# Patient Record
Sex: Male | Born: 1975 | Race: White | Hispanic: No | Marital: Married | Smoking: Never smoker
Health system: Southern US, Community
[De-identification: ages and names within clinical notes are randomized; demographics above are authoritative.]

## PROBLEM LIST (undated history)

## (undated) DIAGNOSIS — R011 Cardiac murmur, unspecified: Secondary | ICD-10-CM

## (undated) DIAGNOSIS — G43909 Migraine, unspecified, not intractable, without status migrainosus: Secondary | ICD-10-CM

## (undated) DIAGNOSIS — Z531 Procedure and treatment not carried out because of patient's decision for reasons of belief and group pressure: Secondary | ICD-10-CM

## (undated) DIAGNOSIS — D6800 Von Willebrand disease, unspecified: Secondary | ICD-10-CM

## (undated) DIAGNOSIS — IMO0001 Reserved for inherently not codable concepts without codable children: Secondary | ICD-10-CM

## (undated) HISTORY — DX: Cardiac murmur, unspecified: R01.1

## (undated) HISTORY — PX: FOOT SURGERY: SHX648

## (undated) HISTORY — DX: Migraine, unspecified, not intractable, without status migrainosus: G43.909

## (undated) HISTORY — PX: WISDOM TOOTH EXTRACTION: SHX21

---

## 2005-08-04 ENCOUNTER — Ambulatory Visit: Payer: Self-pay | Admitting: Internal Medicine

## 2005-08-17 ENCOUNTER — Ambulatory Visit (HOSPITAL_COMMUNITY): Admission: RE | Admit: 2005-08-17 | Discharge: 2005-08-18 | Payer: Self-pay | Admitting: Surgery

## 2005-08-17 ENCOUNTER — Encounter (INDEPENDENT_AMBULATORY_CARE_PROVIDER_SITE_OTHER): Payer: Self-pay | Admitting: *Deleted

## 2006-08-24 IMAGING — RF DG CHOLANGIOGRAM OPERATIVE
1 series · 4 of 4 positions shown · non-contrast
Comparison: none

CLINICAL DATA: The patient has gallstones.
 INTRAOPERATIVE CHOLANGIOGRAM:
TECHNIQUE: Multiple fluoroscopic spot radiographs were obtained during intraoperative cholangiogram, and are submitted for interpretation post-operatively.
 No comparison.

[Series 1: run · 4 of 53 frames shown]
[frame 8/53]
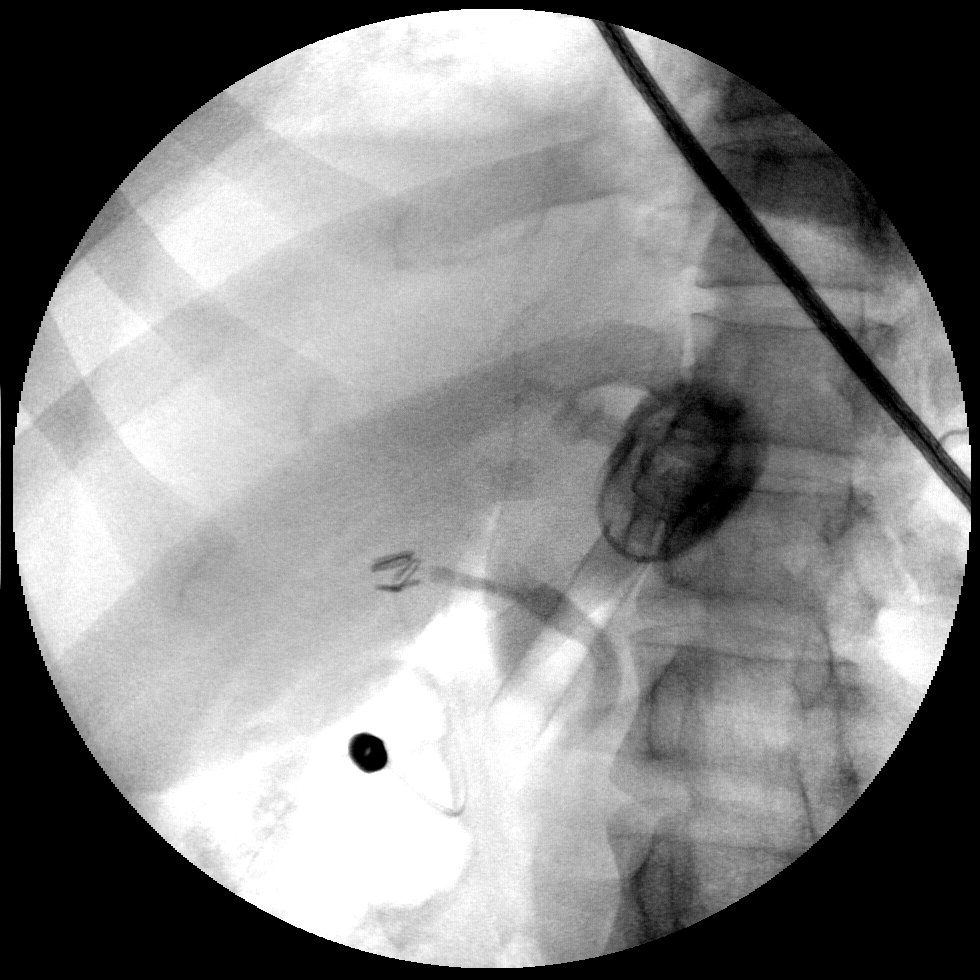
[frame 27/53]
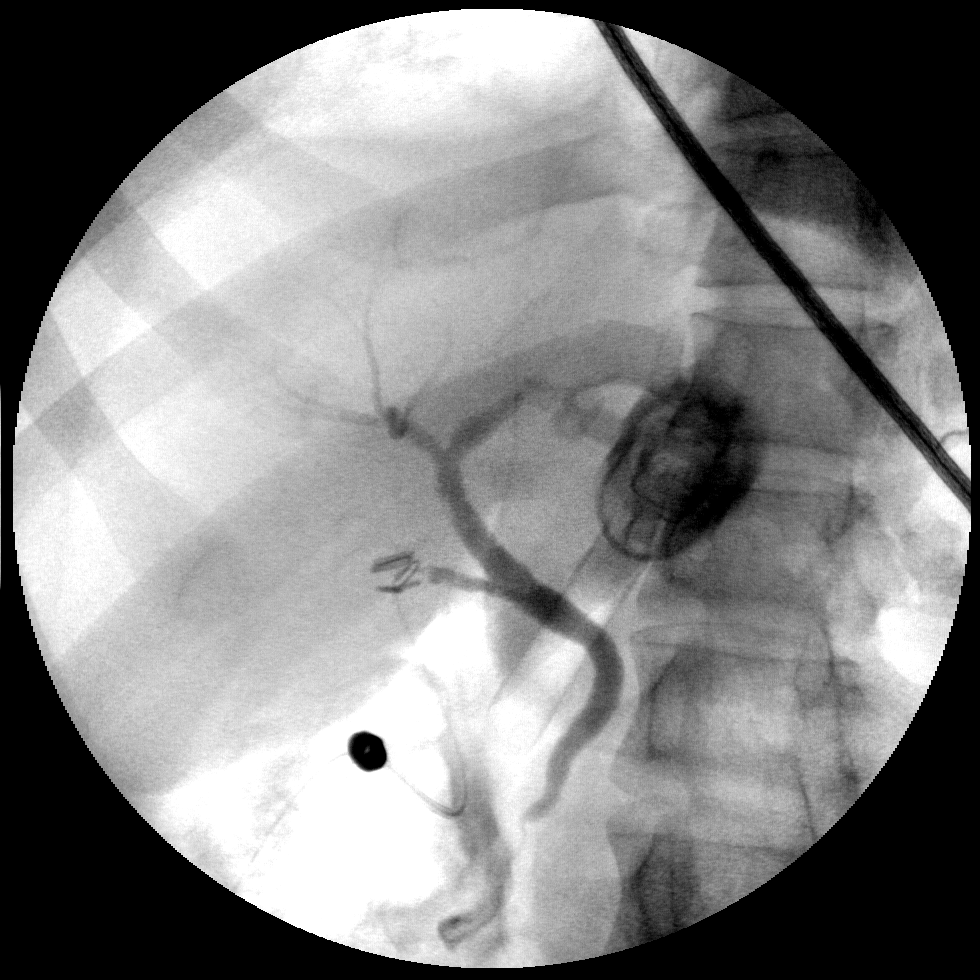
[frame 45/53]
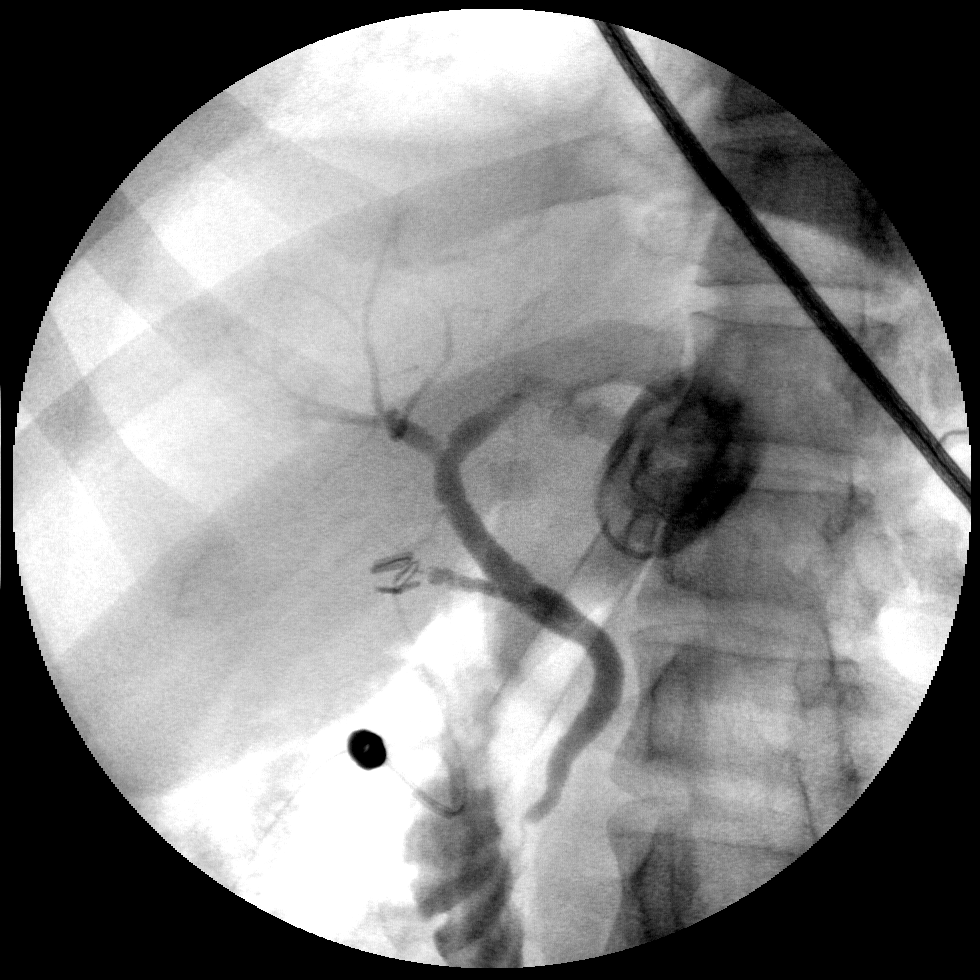
[frame 46/53]
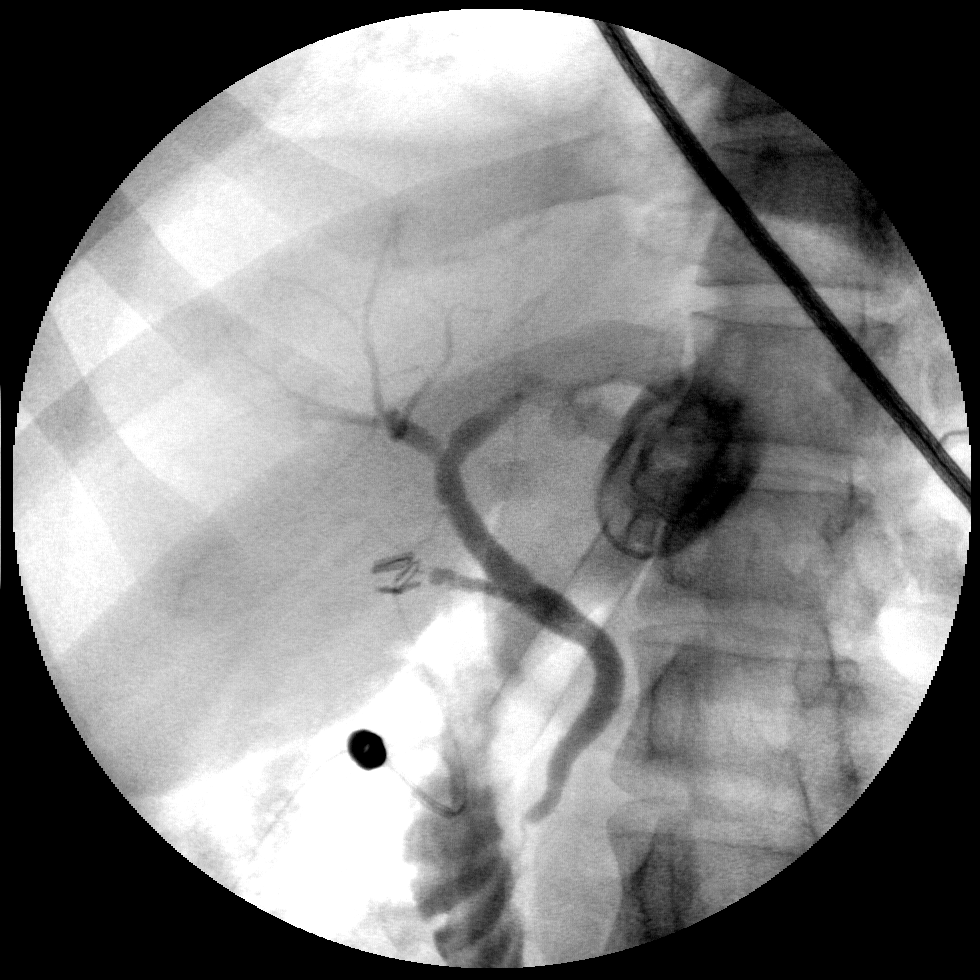

[4 of 4 positions shown; findings below may reference images not displayed]

FINDINGS: The common bile duct is well visualized without evidence of obstruction.  There is prompt filling of the duodenum.
IMPRESSION: No evidence of obstruction or retained gallstones in the common bile duct.

## 2008-07-25 ENCOUNTER — Encounter (INDEPENDENT_AMBULATORY_CARE_PROVIDER_SITE_OTHER): Payer: Self-pay | Admitting: Otolaryngology

## 2008-07-25 ENCOUNTER — Ambulatory Visit (HOSPITAL_COMMUNITY): Admission: RE | Admit: 2008-07-25 | Discharge: 2008-07-25 | Payer: Self-pay | Admitting: Otolaryngology

## 2011-03-24 NOTE — Op Note (Signed)
NAME:  George Richard, George Richard NO.:  [id number]   MEDICAL RECORD NO.:  [id number]          PATIENT TYPE:  AMB   LOCATION:  SDS                          FACILITY:  [facility]   PHYSICIAN:  Antony Contras, MD     DATE OF BIRTH:  12-Oct-1976   DATE OF PROCEDURE:  07/25/2008  DATE OF DISCHARGE:  07/25/2008                               OPERATIVE REPORT   PREOPERATIVE DIAGNOSES:  1. Septal deviation.  2. Turbinate hypertrophy.  3. Internal nasal valve collapse.  4. Dorsal hump.  5. Left tongue lesion.   POSTOPERATIVE DIAGNOSES:  1. Septal deviation.  2. Turbinate hypertrophy.  3. Internal nasal valve collapse.  4. Dorsal hump.  5. Left tongue lesion.   PROCEDURE:  1. Septoplasty.  2. Bilateral inferior turbinate reduction.  3. Excision of left tongue lesion measuring 0.8 cm.  4. Open rhinoplasty with placement of butterfly graft and hump      reduction.   SURGEON:  Antony Contras, MD   ANESTHESIA:  General endotracheal anesthesia.   COMPLICATIONS:  None.   INDICATIONS:  The patient is a 35 year old male, who has a life-long  history of nasal obstruction, which is worse on the right side.  The  problem has been worse for the past year.  His nose breathing whistle  sometimes.  He chokes at night and wakes up gasping at times.  He snores  only occasionally.  Also, he has had a lesion on the left side of his  tongue for a year and a half.   Findings demonstrated a severely deviated nasal septum toward the right  anteriorly and with a large spur to the left.  Turbinates are moderately  enlarged.  The internal nasal valves are narrowed as is the external  nose at the valve area.  He has a hump of the dorsum as well.  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  He presents to the operating room for  surgical management.  He has mild von Willebrand disease and was given  DDAVP preoperatively.   FINDINGS:  As above.   DESCRIPTION OF PROCEDURE:  The patient was identified in the holding  room, an informed consent having been obtained including discussion of  risks, benefits, and alternatives, the patient was brought to the  operating suite and placed on the operating table in supine position.  Anesthesia was induced and the patient was intubated by anesthesia team  without difficulty.  The patient was given intravenous antibiotics and  steroids during the case.  The eyes taped closed.  The tongue was  grasped with a towel clip and pulled anteriorly.  The site of the left  lateral tongue lesion was injected with 1% lidocaine with 1:100,000  epinephrine.  An elliptical incision was made around the tongue lesion  using Bovie electrocautery.  This was then extended deeply until the  lesion was removed also using electrocautery.  The site was then closed  with 3-0 chromic suture in a simple interrupted fashion.  The  lesion was  given to nursing for permanent pathology.  At this point, the nose was  packed with Afrin-soaked pledgets on both sides and the external nose  was marked with marking pen.  The face including the left ear was  prepped and draped in sterile fashion.  After several minutes, the  pledgets were removed and the nasal septum was injected with 1%  lidocaine with 1:100,000 epinephrine.  This was done on both sides.  A  left-sided Killian incision was then made using a #15 blade scalpel and  a subperichondrial flap was elevated along the left side of cartilage  beyond the bony cartilaginous junction and around the large spur.  A  hole was made in the mucosal flap elevating it off of the spur.  The  front of the caudal end of the septum was then dissected along the  opposite side and elevating the subperichondrial flap also on the  opposite side.  The large spur on the left side was then removed by  first removing the cartilage portion using a #15 blade scalpel and  Market researcher.  The bony portion was then removed by elevating soft tissues  off of both sides and using an osteotome.  The anterior septal cartilage  was then examined and a large deviation was seen toward the right side.  Thus, a rectangular-shaped piece of cartilage was removed using #15  blade scalpel and Freer elevator largely reducing the deviation.  This  cartilage was placed in saline.  The caudal strut of the cartilage was  then placed along the left side of the maxillary spine and secured there  using a 5-0 nylon suture.  This helped to further reduce the deviation  of the caudal strut.  At this point, the inferior turbinates were  injected with 1% lidocaine with 1:100,000 epinephrine on both sides.  The stab incision made anteriorly using a #15 blade scalpel and a Market researcher was used to elevate the soft tissues off the underlying bone.  This was done very easily on the right side and more difficult on the  left side due to scar.  On the left side, a hole was made in the flap  and the soft tissues were further elevated off the underlying bone.  The  inferior portion of the bone was then fractured and removed in piecemeal  fashion using Takahashi forceps and rongeurs.  The soft tissue was then  reduced using the turbinate blade and the microdebrider.  The inferior  portion of the turbinate was hanging and was then secured to the  remainder of the turbinate using 4-0 chromic with a single suture.  On  the right side, the flap elevated better and the submucosal tissue was  removed using the microdebrider.  Soft tissues were redraped.  Afrin  pledgets were replaced in the nose.  The external nasal and other side  of the rhinoplasty incision was then marked with marking pen including a  columellar incision and then a marginal incision along the edge of the  lower lateral cartilage.  The external nose was then injected with 1%  lidocaine with 1:100,000 epinephrine.  After  several minutes, the  columellar incision was made #15 blade scalpel followed by the marginal  incision.  Soft tissues were then elevated using the scissors and hooks,  elevating the nasal tip.  Bleeding was controlled with bipolar  electrocautery.  Soft tissues were then elevated off of the lower  lateral cartilages starting on the  left side and then working on the  right side by pulling down the cartilage with a hook and elevating the  soft tissues as well.  This continued until the lower lateral cartilages  were fully exposed.  Once exposed, it became clear that the lateral  domes of both lower lateral cartilages were concave.  Soft tissues were  then further elevated off the upper lateral cartilages using blunt and  sharp dissection and a retractor.  This was done up to the root of the  nose.  At this point, the septal cartilage was examined and found to  have a good curvature for butterfly graft.  Thus, an ear cartilage graft  was not used.  The cartilage was then carved with a #15 blade scalpel  and then laid over the lower lateral cartilages centered over the  midline.  The lower lateral cartilages were then sutured to the  cartilage graft using a 5-0 nylon suture in a mattress suture on both  sides.  This allowed to make the lower laterals more convex to hold the  mouth.  At this point, the dorsal hump was examined and the external  nose markings were utilized to estimate the hump reduction.  This was  then performed with #15 blade scalpel removing portion of the upper  lateral cartilages along the midline.  This included portion of the  septal cartilage.  A rasp was then used to blend this into the nasal  bones superiorly.  Careful dissection was performed to try to make this  to smoothen and flat as possible.  The upper lateral cartilages were  then secured to the septal cartilage using the 5-0 nylon in two simple  sutures.  At this point, the nasal dissection was suctioned and  the skin  and soft tissue envelope laid back over the cartilages.  The columellar  incision and marginal incision along the columella were closed with 5-0  nylon suture in a simple interrupted fashion.  A single 4-0 chromic was  placed in the lateral portion of the marginal incision.  Dual stents  were then placed in both nasal passages coated in bacitracin ointment.  The Killian incision was closed with 4-0 chromic suture in a simple  interrupted fashion.  The stents were then placed in the proper position  and secured with a single 2-0 chromic suture with a mattress stitch.  The nose was suctioned prior to placing the stents.  The external nose  was then coated with benzoin and Steri-Strips cut for the nose.  A  thermoplastic splint was then placed in hot water until it was malleable  and then splayed over the nose until it hardened.  The throat was then  suctioned out and the patient was then returned to Anesthesia for wake-  up.  He was extubated and taken to the recovery room in stable  condition.      Antony Contras, MD  Electronically Signed     DDB/MEDQ  D:  07/25/2008  T:  07/26/2008  Job:  [id number]

## 2011-03-27 NOTE — Op Note (Signed)
NAME:  George Richard, George Richard NO.:  [id number]   MEDICAL RECORD NO.:  [id number]          PATIENT TYPE:  AMB   LOCATION:  DAY                          FACILITY:  [facility]   PHYSICIAN:  Sandria Bales. Ezzard Standing, M.D.  DATE OF BIRTH:  May 27, 1976   DATE OF PROCEDURE:  08/17/2005  DATE OF DISCHARGE:                                 OPERATIVE REPORT   PREOPERATIVE DIAGNOSIS:  Chronic cholecystitis with cholelithiasis.   POSTOPERATIVE DIAGNOSIS:  Severe chronic cholecystitis with cholelithiasis.   PROCEDURE:  Laparoscopic cholecystectomy with intraoperative cholangiogram.   SURGEON:  Sandria Bales. Ezzard Standing, M.D.   FIRST ASSISTANT:  Thornton Park. Daphine Deutscher, M.D.   ANESTHESIA:  General endotracheal.   ESTIMATED BLOOD LOSS:  Minimal.   INDICATION FOR PROCEDURE:  Mr. Laduca is a 35 year old white male, a patient  of Dr. Tally Joe, who has recurrent cholecystitis with cholelithiasis.  He also has von Willebrand's disease and has seen Dr. Si Gaul for  preoperative basis and has been given DDAVP preoperatively along with preop  antibiotics.   The indications and potential complications have been explained to the  patient.  Potential complications include but are not limited to bleeding,  infection, possibility of open surgery, and possibility of duct or bowel  injury.  The patient is a TEFL teacher Witness and understands that if bleeding  becomes a complication, he does not and will not take blood products even if  the condition is life threatening.   OPERATIVE NOTE:  The patient was placed in the supine position and given a  general endotracheal anesthetic.  His abdomen was prepped with Betadine  solution and sterilely draped and given 1 g Ancef at the initiation of  procedure.  He had an infraumbilical incision made with sharp dissection  carried down to the abdominal cavity.  A 10-mm 0-degree laparoscope was  inserted through a 12-mm Hasson trocar and the Hasson trocar was sutured  with a  0 Vicryl suture.  Abdominal exploration revealed right and left lobes  of the liver unremarkable, anterior wall of the stomach unremarkable.  The  bowel that could be seen was unremarkable.  There was no evidence of  inguinal hernia.  Pelvis was unremarkable.   Three additional trocars were placed, a 10-mm subxiphoid location, 5-mm port  in the right mid subcostal, and a 5-mm port in the right lateral subcostal  location.  The gallbladder was then identified and grasped.  The gallbladder  was noted to be with a very thickened wall with multiple gallstones  consistent with severe chronic cholecystitis.  He did have adhesions of  omentum attached to the gallbladder which were taken down with Bovie  electrocautery.  I then got down where I identified the lymph node of Calot  which actually I was able to drop back away from the field, identify the  triangle of Calot, identify the cystic artery which I triply endoclipped and  divided, and identified the cystic duct.  I placed a clip on the gallbladder  side of the cystic duct.   I then shot an intraoperative cholangiogram.  The intraoperative  cholangiogram was shot using a cut-off taut catheter inserted through a 14-  gauge Jelco, inserted through the side of the cut cystic duct.  The taut  catheter was secured with an endoclip and under fluoroscopy, I injected  about 8 mL of half strength high opaque solution.  This showed free flow of  contrast down the cystic duct into the common bile duct into the duodenum  and up the hepatic radicals.  There was no filling defect.  There was no  leak and this was felt to be a normal intraoperative cholangiogram.   The taut catheter was then removed.  The cystic duct was triply endoclipped  and divided.  The gallbladder was then sharply and bluntly dissected from  the gallbladder bed using primarily hook Bovie coagulation.  The gallbladder  was then delivered into an EndoCatch bag and delivered through  the  umbilicus.  Again the patient had a gallbladder chalked full of large stones  with a really thickened gallbladder wall.  I then reinspected the  gallbladder bed.  There was no bleeding from the gallbladder bed.  I  reinspected the triangle of Calot.  There was no bleeding or bile leak.  I  did place a piece of Surgicel in the bed of the gallbladder, though it was  dry because of his history of Willebrand's disease.   The trocars were then removed and there was no bleeding at any trocar site.  The umbilical trocar was closed with 0 Vicryl suture.  The skin at the other  sites was closed with a running 5-0 Monocryl suture, benzoin and Steri-  Strips.   The patient tolerated the procedure well.  Sponge and needle count were  correct at the end of the case.  Had no evidence of any excessive bleeding  at the time of surgery.  Again, he did get the DDAVP preoperatively.  I will  not plan to redose him unless he has any problems.      Sandria Bales. Ezzard Standing, M.D.  Electronically Signed     DHN/MEDQ  D:  08/17/2005  T:  08/17/2005  Job:  [id number]   cc:   Tally Joe, M.D.  Fax: [phone]   Lajuana Matte, MD  Fax: [phone]

## 2011-08-10 LAB — PROTIME-INR
INR: 1.2
Prothrombin Time: 15.3 — ABNORMAL HIGH

## 2011-08-10 LAB — CBC
HCT: 42.9
Hemoglobin: 15
MCHC: 35
MCV: 84.5
Platelets: 193
RBC: 5.07
RDW: 13.4
WBC: 6.2

## 2011-08-10 LAB — APTT: aPTT: 32

## 2011-08-10 LAB — NO BLOOD PRODUCTS

## 2012-05-03 ENCOUNTER — Ambulatory Visit (INDEPENDENT_AMBULATORY_CARE_PROVIDER_SITE_OTHER): Payer: 59 | Admitting: Nurse Practitioner

## 2012-05-03 ENCOUNTER — Encounter: Payer: Self-pay | Admitting: Nurse Practitioner

## 2012-05-03 VITALS — BP 124/71 | HR 68 | Ht 72.0 in | Wt 218.0 lb

## 2012-05-03 DIAGNOSIS — G43909 Migraine, unspecified, not intractable, without status migrainosus: Secondary | ICD-10-CM

## 2012-05-03 MED ORDER — VENLAFAXINE HCL ER 75 MG PO CP24: 75.0000 mg | ORAL_CAPSULE | Freq: Every day | ORAL | Status: DC

## 2012-05-03 MED ORDER — HYDROCODONE-ACETAMINOPHEN 7.5-500 MG PO TABS: 1.0000 | ORAL_TABLET | Freq: Four times a day (QID) | ORAL | Status: AC | PRN

## 2012-05-03 MED ORDER — ONDANSETRON HCL 4 MG PO TABS: 4.0000 mg | ORAL_TABLET | Freq: Three times a day (TID) | ORAL | Status: AC | PRN

## 2012-05-03 MED ORDER — TIZANIDINE HCL 6 MG PO CAPS: 6.0000 mg | ORAL_CAPSULE | Freq: Two times a day (BID) | ORAL | Status: AC | PRN

## 2012-05-03 NOTE — Patient Instructions (Signed)

## 2012-05-03 NOTE — Progress Notes (Signed)
Diagnosis: Migraine  History: New patient today with long standing migraine. He remembers having migraine since childhood. He has a strong family history that included mother and both brothers. He is now having a daily migraine of some intensity. Currently he is seeing Dr Clelia Croft who has him on Zonegran 100mg . Since last week he did as advised go up to 2 tabs at night. He did see a difference but also has noticed hair loss with this medication. He has some issues with sleep. DFS and DMS. It takes him 30- 45 minutes to fall asleep and he is awake about every 2 hours. When he takes medications like Topamax or Zonegran he often has some sleep/ awake issues. He does have some stresses/ anxiety in his life. His mother and brothers have depression issues.  Location: Bilateral Temples  Number of Headache days/month: Severe: 15 Moderate: 5 Mild:8  Current Outpatient Prescriptions on File Prior to Visit  Medication Sig Dispense Refill  . SUMAtriptan (IMITREX) 100 MG tablet Take 100 mg by mouth every 2 (two) hours as needed.      . zonisamide (ZONEGRAN) 100 MG capsule Take 100 mg by mouth daily.        Acute prevention: Zonegran  Past Medical History  Diagnosis Date  . Heart murmur     childhood  . Migraine    Past Surgical History  Procedure Date  . Foot surgery     right  . Wisdom tooth extraction     x4   Family History  Problem Relation Age of Onset  . Migraines Mother   . Diabetes Father    Social History:  reports that he has never smoked. He does not have any smokeless tobacco history on file. He reports that he drinks alcohol. He reports that he does not use illicit drugs. Allergies: No Known Allergies  Triggers: Stress  Birth control: Male  ROS: Positive for migraine, nausea, anxiety, muscle spasm  Exam: Well developed, well nourished Hispanic male  General: NAD HEENT: Negative Cardiac: RRR Lungs: clear Neuro: Negative Skin: Warm and dry  Impression:migraine -  common  Plan: We discussed pathophysiology of migraine. Considering hair loss and anxiety we will switch him over to Effexor for prevention.  Switch Flexeril to Zanaflex Continue Imitrex, naprosyn when gets a migraine. He is given rescue in form of zofran and vicoden.  He would like to trial botox and we will seek approval RTC 3 weeks   Time Spent: One hour

## 2012-05-05 ENCOUNTER — Telehealth: Payer: Self-pay | Admitting: *Deleted

## 2012-05-05 DIAGNOSIS — G43909 Migraine, unspecified, not intractable, without status migrainosus: Secondary | ICD-10-CM

## 2012-05-05 MED ORDER — SUMATRIPTAN SUCCINATE 100 MG PO TABS: 100.0000 mg | ORAL_TABLET | ORAL | Status: DC | PRN

## 2012-05-05 MED ORDER — NAPROXEN 500 MG PO TABS: 500.0000 mg | ORAL_TABLET | Freq: Two times a day (BID) | ORAL | Status: DC

## 2012-05-05 NOTE — Telephone Encounter (Signed)
Patient needs refill of medications he was previously on.

## 2012-06-09 ENCOUNTER — Other Ambulatory Visit: Payer: Self-pay | Admitting: *Deleted

## 2012-06-09 DIAGNOSIS — G43909 Migraine, unspecified, not intractable, without status migrainosus: Secondary | ICD-10-CM

## 2012-06-09 MED ORDER — VENLAFAXINE HCL ER 75 MG PO CP24: 75.0000 mg | ORAL_CAPSULE | Freq: Every day | ORAL | Status: DC

## 2012-06-09 NOTE — Telephone Encounter (Signed)
Patient needs 90 day supply of meds for insurance reasons.

## 2012-07-19 ENCOUNTER — Encounter: Payer: Self-pay | Admitting: Nurse Practitioner

## 2012-07-19 ENCOUNTER — Ambulatory Visit (INDEPENDENT_AMBULATORY_CARE_PROVIDER_SITE_OTHER): Payer: 59 | Admitting: Nurse Practitioner

## 2012-07-19 ENCOUNTER — Telehealth: Payer: Self-pay | Admitting: Nurse Practitioner

## 2012-07-19 VITALS — BP 125/81 | HR 81 | Ht 72.0 in | Wt 219.0 lb

## 2012-07-19 DIAGNOSIS — G43009 Migraine without aura, not intractable, without status migrainosus: Secondary | ICD-10-CM

## 2012-07-19 DIAGNOSIS — G43909 Migraine, unspecified, not intractable, without status migrainosus: Secondary | ICD-10-CM

## 2012-07-19 DIAGNOSIS — M62838 Other muscle spasm: Secondary | ICD-10-CM

## 2012-07-19 MED ORDER — VENLAFAXINE HCL ER 150 MG PO CP24: 150.0000 mg | ORAL_CAPSULE | Freq: Every day | ORAL | Status: AC

## 2012-07-19 NOTE — Telephone Encounter (Signed)
Pt requested increase in dosing of effexor to 150mg 

## 2012-07-26 ENCOUNTER — Encounter: Payer: Self-pay | Admitting: Nurse Practitioner

## 2012-07-26 DIAGNOSIS — M62838 Other muscle spasm: Secondary | ICD-10-CM | POA: Insufficient documentation

## 2012-07-26 NOTE — Progress Notes (Signed)
S: Pt is in office today for follow up on migraine headaches. He does feel the Effexor is helping but that the dose should be higher. He also is having some pains in right temple, neck and shoulder. He is interested in trigger point injections today.  O: Tenderness in right trap, neck and temple  Procedure: See procedure sheet.   A: Migraine with muscle spasm  P: Pt tolerated TPI's well with no difficulties. We will increase dose of Effexor to 150 mg and send him to Physical therapy . He will RTC as needed

## 2012-07-26 NOTE — Patient Instructions (Signed)

## 2012-08-12 ENCOUNTER — Telehealth: Payer: Self-pay | Admitting: *Deleted

## 2012-08-12 ENCOUNTER — Other Ambulatory Visit: Payer: Self-pay | Admitting: *Deleted

## 2012-08-12 DIAGNOSIS — Z9109 Other allergy status, other than to drugs and biological substances: Secondary | ICD-10-CM

## 2012-08-12 MED ORDER — FLUTICASONE PROPIONATE 50 MCG/ACT NA SUSP: 2.0000 | Freq: Every day | NASAL | Status: AC

## 2012-08-12 NOTE — Telephone Encounter (Signed)
Patient needs refill of nasal spray he needs for allergies.

## 2012-09-13 ENCOUNTER — Telehealth: Payer: Self-pay

## 2012-09-13 NOTE — Telephone Encounter (Signed)
Patient has applied for financial assistance through [facility]. Waiting for response. Called physician billing they will push back the billing cycle till they get notice on their application. 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

## 2012-09-26 ENCOUNTER — Other Ambulatory Visit: Payer: Self-pay | Admitting: *Deleted

## 2012-09-26 DIAGNOSIS — G43909 Migraine, unspecified, not intractable, without status migrainosus: Secondary | ICD-10-CM

## 2012-09-26 MED ORDER — RIZATRIPTAN BENZOATE 10 MG PO TABS: 10.0000 mg | ORAL_TABLET | ORAL | Status: DC | PRN

## 2012-09-26 NOTE — Telephone Encounter (Signed)
Patient needs a refill of Maxalt for migraine headaches.

## 2012-11-08 ENCOUNTER — Ambulatory Visit: Payer: 59 | Admitting: Rehabilitative and Restorative Service Providers"

## 2012-11-15 ENCOUNTER — Telehealth: Payer: Self-pay | Admitting: *Deleted

## 2012-11-15 DIAGNOSIS — M62838 Other muscle spasm: Secondary | ICD-10-CM

## 2012-11-15 NOTE — Telephone Encounter (Signed)
Referral expired pt needs new referral.

## 2012-11-29 ENCOUNTER — Ambulatory Visit: Payer: 59 | Admitting: Rehabilitative and Restorative Service Providers"

## 2012-12-09 ENCOUNTER — Ambulatory Visit: Payer: 59 | Admitting: Physical Therapy

## 2013-02-07 ENCOUNTER — Emergency Department (HOSPITAL_COMMUNITY)
Admission: EM | Admit: 2013-02-07 | Discharge: 2013-02-07 | Disposition: A | Discharge status: Home / Self Care | Payer: 59 | Source: Home / Self Care | Attending: Family Medicine | Admitting: Family Medicine

## 2013-02-07 ENCOUNTER — Encounter (HOSPITAL_COMMUNITY): Payer: Self-pay

## 2013-02-07 DIAGNOSIS — F458 Other somatoform disorders: Secondary | ICD-10-CM

## 2013-02-07 DIAGNOSIS — F419 Anxiety disorder, unspecified: Secondary | ICD-10-CM

## 2013-02-07 DIAGNOSIS — Z531 Procedure and treatment not carried out because of patient's decision for reasons of belief and group pressure: Secondary | ICD-10-CM | POA: Insufficient documentation

## 2013-02-07 HISTORY — DX: Reserved for inherently not codable concepts without codable children: IMO0001

## 2013-02-07 HISTORY — DX: Procedure and treatment not carried out because of patient's decision for reasons of belief and group pressure: Z53.1

## 2013-02-07 MED ORDER — CLONAZEPAM 0.5 MG PO TABS: 0.5000 mg | ORAL_TABLET | Freq: Two times a day (BID) | ORAL | Status: DC | PRN

## 2013-02-07 NOTE — ED Provider Notes (Signed)
History     CSN: [id number]  Arrival date & time 02/07/13  1003   First MD Initiated Contact with Patient 02/07/13 1023      Chief Complaint  Patient presents with  . Migraine    (Consider location/radiation/quality/duration/timing/severity/associated sxs/prior treatment) Patient is a 37 y.o. male presenting with migraines. The history is provided by the patient.  Migraine This is a chronic problem. The current episode started yesterday (sen by ha specialist yest given pred, , this am sob, tingling in hands and body, with carpal spasms, better now.). The problem has been gradually improving. Associated symptoms include headaches and shortness of breath.    Past Medical History  Diagnosis Date  . Heart murmur     childhood  . Migraine   . Refusal of blood transfusions as patient is Jehovah's Witness     Past Surgical History  Procedure Laterality Date  . Foot surgery      right  . Wisdom tooth extraction      x4    Family History  Problem Relation Age of Onset  . Migraines Mother   . Diabetes Father     History  Substance Use Topics  . Smoking status: Never Smoker   . Smokeless tobacco: Not on file  . Alcohol Use: Yes     Comment: rare      Review of Systems  Constitutional: Negative.   Respiratory: Positive for shortness of breath. Negative for wheezing.   Cardiovascular: Negative.   Neurological: Positive for light-headedness, numbness and headaches.    Allergies  Review of patient's allergies indicates no known allergies.  Home Medications   Current Outpatient Rx  Name  Route  Sig  Dispense  Refill  . rizatriptan (MAXALT) 10 MG tablet   Oral   Take 1 tablet (10 mg total) by mouth as needed for migraine. May repeat in 2 hours if needed   10 tablet   11     May have 90 day supply.   . venlafaxine XR (EFFEXOR XR) 150 MG 24 hr capsule   Oral   Take 1 capsule (150 mg total) by mouth daily.   90 capsule   4   . clonazePAM (KLONOPIN) 0.5 MG  tablet   Oral   Take 1 tablet (0.5 mg total) by mouth 2 (two) times daily as needed for anxiety.   30 tablet   0   . fluticasone (FLONASE) 50 MCG/ACT nasal spray   Nasal   Place 2 sprays into the nose daily.   48 g   3   . naproxen (NAPROSYN) 500 MG tablet   Oral   Take 1 tablet (500 mg total) by mouth 2 (two) times daily with a meal.   180 tablet   3   . SUMAtriptan (IMITREX) 100 MG tablet   Oral   Take 1 tablet (100 mg total) by mouth every 2 (two) hours as needed.   27 tablet   11   . tizanidine (ZANAFLEX) 6 MG capsule   Oral   Take 1 capsule (6 mg total) by mouth 2 (two) times daily as needed for muscle spasms.   60 capsule   1     BP 122/82  Pulse 94  Temp(Src) 98.1 F (36.7 C) (Oral)  Resp 12  SpO2 97%  Physical Exam  Nursing note and vitals reviewed. Constitutional: He is oriented to person, place, and time. He appears well-developed and well-nourished. He appears distressed.  HENT:  Head: Normocephalic.  Right  Ear: External ear normal.  Left Ear: External ear normal.  Mouth/Throat: Oropharynx is clear and moist.  Eyes: Pupils are equal, round, and reactive to light.  Neck: Normal range of motion. Neck supple.  Cardiovascular: Regular rhythm, normal heart sounds and normal pulses.  Tachycardia present.   Pulmonary/Chest: Effort normal and breath sounds normal.  Lymphadenopathy:    He has no cervical adenopathy.  Neurological: He is alert and oriented to person, place, and time.  Skin: Skin is warm and dry.    ED Course  Procedures (including critical care time)  Labs Reviewed - No data to display No results found.   1. Anxiety hyperventilation       MDM          Linna Hoff, MD 02/07/13 1056

## 2013-02-07 NOTE — ED Notes (Signed)
Lengthy discussion about breath control, neck exercises, relaxation, considering therapist if unable to self control anxiety

## 2013-02-07 NOTE — ED Notes (Signed)
Ongoing history of HA , treatment by HA specialist at [facility].  C/o his arms and legs and tingly States he had a HA went he went to bed last night, and it woke him this AM. Started a prednisone taper last PM, rx by headache specilaist

## 2013-04-04 ENCOUNTER — Emergency Department (HOSPITAL_COMMUNITY)
Admission: EM | Admit: 2013-04-04 | Discharge: 2013-04-05 | Disposition: A | Discharge status: Home / Self Care | Payer: 59 | Attending: Emergency Medicine | Admitting: Emergency Medicine

## 2013-04-04 ENCOUNTER — Encounter (HOSPITAL_COMMUNITY): Payer: Self-pay | Admitting: *Deleted

## 2013-04-04 DIAGNOSIS — Y929 Unspecified place or not applicable: Secondary | ICD-10-CM | POA: Insufficient documentation

## 2013-04-04 DIAGNOSIS — S61209A Unspecified open wound of unspecified finger without damage to nail, initial encounter: Secondary | ICD-10-CM | POA: Insufficient documentation

## 2013-04-04 DIAGNOSIS — R011 Cardiac murmur, unspecified: Secondary | ICD-10-CM | POA: Insufficient documentation

## 2013-04-04 DIAGNOSIS — Y9389 Activity, other specified: Secondary | ICD-10-CM | POA: Insufficient documentation

## 2013-04-04 DIAGNOSIS — W278XXA Contact with other nonpowered hand tool, initial encounter: Secondary | ICD-10-CM | POA: Insufficient documentation

## 2013-04-04 DIAGNOSIS — S61211A Laceration without foreign body of left index finger without damage to nail, initial encounter: Secondary | ICD-10-CM

## 2013-04-04 DIAGNOSIS — IMO0002 Reserved for concepts with insufficient information to code with codable children: Secondary | ICD-10-CM | POA: Insufficient documentation

## 2013-04-04 DIAGNOSIS — G43909 Migraine, unspecified, not intractable, without status migrainosus: Secondary | ICD-10-CM | POA: Insufficient documentation

## 2013-04-04 DIAGNOSIS — Z79899 Other long term (current) drug therapy: Secondary | ICD-10-CM | POA: Insufficient documentation

## 2013-04-04 DIAGNOSIS — Z862 Personal history of diseases of the blood and blood-forming organs and certain disorders involving the immune mechanism: Secondary | ICD-10-CM | POA: Insufficient documentation

## 2013-04-04 HISTORY — DX: Von Willebrand's disease: D68.0

## 2013-04-04 HISTORY — DX: Von Willebrand disease, unspecified: D68.00

## 2013-04-04 MED ORDER — IBUPROFEN 800 MG PO TABS: 800.0000 mg | ORAL_TABLET | Freq: Three times a day (TID) | ORAL | Status: DC

## 2013-04-04 NOTE — ED Provider Notes (Signed)
History    This chart was scribed for non-physician practitioner Arnoldo Hooker PA-C working with Glynn Octave, MD by Donne Anon, ED Scribe. This patient was seen in room TR09C/TR09C and the patient's care was started at 2214.   CSN: [id number]  Arrival date & time 04/04/13  2108   First MD Initiated Contact with Patient 04/04/13 2214      Chief Complaint  Patient presents with  . Laceration    The history is provided by the patient. No language interpreter was used.   HPI Comments: George Richard is a 37 y.o. male with hx of Vonwillebrand disease, who presents to the Emergency Department complaining of a laceration to his left index finger which began immediately PTA. He was cutting wire with scissors and accidentally cut his finger. His Tetanus shot is not UTD. He denies abdominal pain, fever, CP or any other pain.  Past Medical History  Diagnosis Date  . Heart murmur     childhood  . Migraine   . Refusal of blood transfusions as patient is Jehovah's Witness   . Von Willebrand disease     Past Surgical History  Procedure Laterality Date  . Foot surgery      right  . Wisdom tooth extraction      x4    Family History  Problem Relation Age of Onset  . Migraines Mother   . Diabetes Father     History  Substance Use Topics  . Smoking status: Never Smoker   . Smokeless tobacco: Not on file  . Alcohol Use: Yes     Comment: rare      Review of Systems  Constitutional: Negative for fever.  Respiratory: Negative for shortness of breath.   Cardiovascular: Negative for chest pain.  Gastrointestinal: Negative for abdominal pain.  Skin: Positive for wound.    Allergies  Review of patient's allergies indicates no known allergies.  Home Medications   Current Outpatient Rx  Name  Route  Sig  Dispense  Refill  . clonazePAM (KLONOPIN) 0.5 MG tablet   Oral   Take 1 tablet (0.5 mg total) by mouth 2 (two) times daily as needed for anxiety.   30 tablet   0   .  fluticasone (FLONASE) 50 MCG/ACT nasal spray   Nasal   Place 2 sprays into the nose daily.   48 g   3   . naproxen (NAPROSYN) 500 MG tablet   Oral   Take 1 tablet (500 mg total) by mouth 2 (two) times daily with a meal.   180 tablet   3   . rizatriptan (MAXALT) 10 MG tablet   Oral   Take 1 tablet (10 mg total) by mouth as needed for migraine. May repeat in 2 hours if needed   10 tablet   11     May have 90 day supply.   . SUMAtriptan (IMITREX) 100 MG tablet   Oral   Take 1 tablet (100 mg total) by mouth every 2 (two) hours as needed.   27 tablet   11   . tizanidine (ZANAFLEX) 6 MG capsule   Oral   Take 1 capsule (6 mg total) by mouth 2 (two) times daily as needed for muscle spasms.   60 capsule   1   . venlafaxine XR (EFFEXOR XR) 150 MG 24 hr capsule   Oral   Take 1 capsule (150 mg total) by mouth daily.   90 capsule   4  BP 133/87  Pulse 87  Temp(Src) 98.5 F (36.9 C) (Oral)  Resp 18  SpO2 97%  Physical Exam  Nursing note and vitals reviewed. Constitutional: He is oriented to person, place, and time. He appears well-developed and well-nourished. No distress.  HENT:  Head: Normocephalic and atraumatic.  Eyes: EOM are normal.  Neck: Neck supple. No tracheal deviation present.  Cardiovascular: Normal rate.   Pulmonary/Chest: Effort normal. No respiratory distress.  Musculoskeletal: Normal range of motion.  2 cm laceration over the left second PIP joint on palmar surface. Actively bleeding. No tendon deficits. Neurosensory exam intact.   Neurological: He is alert and oriented to person, place, and time.  Skin: Skin is warm and dry.  Psychiatric: He has a normal mood and affect. His behavior is normal.    ED Course  Procedures (including critical care time) DIAGNOSTIC STUDIES: Oxygen Saturation is 97% on room air, adequate by my interpretation.    COORDINATION OF CARE: 10:28 PM Discussed treatment plan which includes Tetanus shot and laceration  repair with pt at bedside and pt agreed to plan.   LACERATION REPAIR Performed by: Arnoldo Hooker PA-C Consent: Verbal consent obtained. Risks and benefits: risks, benefits and alternatives were discussed Patient identity confirmed: provided demographic data Time out performed prior to procedure Prepped and Draped in normal sterile fashion Wound explored Laceration Location: left second PIP joint on palmar surface Laceration Length: 2 cm No Foreign Bodies seen or palpated Anesthesia: local infiltration Local anesthetic: lidocaine 2% without epinephrine Anesthetic total: 1 ml Irrigation method: syringe Amount of cleaning: standard Skin closure: 4-0 Prolene Number of sutures or staples: 3 Technique: simple interrupted Patient tolerance: Patient tolerated the procedure well with no immediate complications.   Labs Reviewed - No data to display No results found.   No diagnosis found. 1. Laceration left index finger   MDM  Uncomplicated finger laceration     I personally performed the services described in this documentation, which was scribed in my presence. The recorded information has been reviewed and is accurate.      Arnoldo Hooker, PA-C 04/04/13 2327

## 2013-04-04 NOTE — ED Notes (Signed)
Patient states that he was cutting some wires with a pair of scissors and the scissors slipped.  He has a 1 cm Lac on his left, lateral index finger. Bleeding is controlled.

## 2013-04-04 NOTE — ED Notes (Addendum)
Pt was cutting with scissors and accidentally cut left index index finger.  Wiggles finger.  Rebandage.  Pt has vonwillebrand disease.  Pt cut is small and pressure dressing applied.  Not bleeding through

## 2013-04-05 NOTE — ED Provider Notes (Signed)
Medical screening examination/treatment/procedure(s) were performed by non-physician practitioner and as supervising physician I was immediately available for consultation/collaboration.   Glynn Octave, MD 04/05/13 [phone]

## 2013-09-12 ENCOUNTER — Encounter: Payer: Self-pay | Admitting: Nurse Practitioner

## 2013-09-12 ENCOUNTER — Ambulatory Visit (INDEPENDENT_AMBULATORY_CARE_PROVIDER_SITE_OTHER): Payer: 59 | Admitting: Nurse Practitioner

## 2013-09-12 VITALS — BP 123/77 | HR 72 | Ht 72.0 in | Wt 227.0 lb

## 2013-09-12 DIAGNOSIS — F419 Anxiety disorder, unspecified: Secondary | ICD-10-CM

## 2013-09-12 DIAGNOSIS — F411 Generalized anxiety disorder: Secondary | ICD-10-CM

## 2013-09-12 DIAGNOSIS — M62838 Other muscle spasm: Secondary | ICD-10-CM

## 2013-09-12 DIAGNOSIS — G43909 Migraine, unspecified, not intractable, without status migrainosus: Secondary | ICD-10-CM

## 2013-09-12 MED ORDER — NAPROXEN 500 MG PO TABS: 500.0000 mg | ORAL_TABLET | Freq: Two times a day (BID) | ORAL | Status: AC

## 2013-09-12 MED ORDER — SUMATRIPTAN SUCCINATE 100 MG PO TABS: 100.0000 mg | ORAL_TABLET | ORAL | Status: AC | PRN

## 2013-09-12 MED ORDER — CLONAZEPAM 1 MG PO TABS: 1.0000 mg | ORAL_TABLET | Freq: Two times a day (BID) | ORAL | Status: DC | PRN

## 2013-09-12 MED ORDER — RIZATRIPTAN BENZOATE 10 MG PO TABS: 10.0000 mg | ORAL_TABLET | ORAL | Status: DC | PRN

## 2013-09-12 NOTE — Progress Notes (Signed)
History:  George Richard is a 37 y.o. . who presents to [facility] today for follow up on his migraine headaches. Since our last visit he has had an ER visit in which he was diagnosed with an anxiety attack and given Klonopin. He rarely takes this but it helps when the anxiety is impacting him. He has also completed his migraine study with the CGRP blocker. He felt very good on that medication and in fact felt headache free. After the study he decided to wean himself off the Effexor due to side effects of nausea. At this time he does not want to go back on any prevention. He has been on Topamax. Severe 1  Moderate 4  Mild daily. He declines Botox for muscle spasm and headache prevention.  The following portions of the patient's history were reviewed and updated as appropriate: allergies, current medications, past family history, past medical history, past social history, past surgical history and problem list.  Review of Systems:  Pertinent items are noted in HPI.  Objective:  Physical Exam BP 123/77  Pulse 72  Ht 6' (1.829 m)  Wt 227 lb (102.967 kg)  BMI 30.78 kg/m2 GENERAL: Well-developed, well-nourished male in no acute distress.  HEENT: Normocephalic, atraumatic.  NECK: Supple. Normal thyroid.  LUNGS: Normal rate. Clear to auscultation bilaterally.  HEART: Regular rate and rhythm with no adventitious sounds.  EXTREMITIES: No cyanosis, clubbing, or edema, 2+ distal pulses.   Labs and Imaging No results found.  Assessment & Plan:  Assessment:  Migraine  Anxiety Muscle Spasm  Plans: He has chosen not to go back on prevention for now and will treat migraines as they occur Refill medications Discussed exercise program consisting of 30 min walk/ run daily RTC as needed   Carolynn Serve, NP 09/12/2013 9:01 AM

## 2013-09-12 NOTE — Patient Instructions (Signed)
Migraine Headache A migraine headache is an intense, throbbing pain on one or both sides of your head. A migraine can last for 30 minutes to several hours. CAUSES  The exact cause of a migraine headache is not always known. However, a migraine may be caused when nerves in the brain become irritated and release chemicals that cause inflammation. This causes pain. SYMPTOMS  Pain on one or both sides of your head.  Pulsating or throbbing pain.  Severe pain that prevents daily activities.  Pain that is aggravated by any physical activity.  Nausea, vomiting, or both.  Dizziness.  Pain with exposure to bright lights, loud noises, or activity.  General sensitivity to bright lights, loud noises, or smells. Before you get a migraine, you may get warning signs that a migraine is coming (aura). An aura may include:  Seeing flashing lights.  Seeing bright spots, halos, or zig-zag lines.  Having tunnel vision or blurred vision.  Having feelings of numbness or tingling.  Having trouble talking.  Having muscle weakness. MIGRAINE TRIGGERS  Alcohol.  Smoking.  Stress.  Menstruation.  Aged cheeses.  Foods or drinks that contain nitrates, glutamate, aspartame, or tyramine.  Lack of sleep.  Chocolate.  Caffeine.  Hunger.  Physical exertion.  Fatigue.  Medicines used to treat chest pain (nitroglycerine), birth control pills, estrogen, and some blood pressure medicines. DIAGNOSIS  A migraine headache is often diagnosed based on:  Symptoms.  Physical examination.  A CT scan or MRI of your head. TREATMENT Medicines may be given for pain and nausea. Medicines can also be given to help prevent recurrent migraines.  HOME CARE INSTRUCTIONS  Only take over-the-counter or prescription medicines for pain or discomfort as directed by your caregiver. The use of long-term narcotics is not recommended.  Lie down in a dark, quiet room when you have a migraine.  Keep a journal  to find out what may trigger your migraine headaches. For example, write down:  What you eat and drink.  How much sleep you get.  Any change to your diet or medicines.  Limit alcohol consumption.  Quit smoking if you smoke.  Get 7 to 9 hours of sleep, or as recommended by your caregiver.  Limit stress.  Keep lights dim if bright lights bother you and make your migraines worse. SEEK IMMEDIATE MEDICAL CARE IF:   Your migraine becomes severe.  You have a fever.  You have a stiff neck.  You have vision loss.  You have muscular weakness or loss of muscle control.  You start losing your balance or have trouble walking.  You feel faint or pass out.  You have severe symptoms that are different from your first symptoms. MAKE SURE YOU:   Understand these instructions.  Will watch your condition.  Will get help right away if you are not doing well or get worse. Document Released: 10/26/2005 Document Revised: 01/18/2012 Document Reviewed: 10/16/2011 [employer] Patient Information 2014 [employer].  

## 2013-11-15 ENCOUNTER — Ambulatory Visit (INDEPENDENT_AMBULATORY_CARE_PROVIDER_SITE_OTHER): Payer: 59 | Admitting: *Deleted

## 2013-11-15 DIAGNOSIS — Z23 Encounter for immunization: Secondary | ICD-10-CM

## 2014-02-28 ENCOUNTER — Other Ambulatory Visit: Payer: Self-pay | Admitting: *Deleted

## 2014-02-28 DIAGNOSIS — F419 Anxiety disorder, unspecified: Secondary | ICD-10-CM

## 2014-02-28 DIAGNOSIS — G43909 Migraine, unspecified, not intractable, without status migrainosus: Secondary | ICD-10-CM

## 2014-02-28 MED ORDER — HYDROCODONE-ACETAMINOPHEN 7.5-500 MG PO TABS
1.0000 | ORAL_TABLET | Freq: Four times a day (QID) | ORAL | Status: AC | PRN
Start: 2014-02-28 — End: ?

## 2014-02-28 MED ORDER — CLONAZEPAM 1 MG PO TABS: 1.0000 mg | ORAL_TABLET | Freq: Two times a day (BID) | ORAL | Status: AC | PRN

## 2014-02-28 MED ORDER — RIZATRIPTAN BENZOATE 10 MG PO TABS: 10.0000 mg | ORAL_TABLET | ORAL | Status: AC | PRN

## 2014-02-28 NOTE — Telephone Encounter (Signed)
Pt needed a refill on Vicodin.  I have printed off the prescription for pt.  Ok per [employer].

## 2014-02-28 NOTE — Telephone Encounter (Signed)
Pt called and needed a refill on his Maxalt and Klonopin.  I have sent in refills to patients pharmacy.  Pt aware.

## 2016-06-12 ENCOUNTER — Encounter: Payer: 59 | Admitting: Internal Medicine
